# Patient Record
Sex: Female | Born: 1940 | Race: White | Hispanic: No | Smoking: Never smoker
Health system: Southern US, Community
[De-identification: ages and names within clinical notes are randomized; demographics above are authoritative.]

## PROBLEM LIST (undated history)

## (undated) DIAGNOSIS — C189 Malignant neoplasm of colon, unspecified: Secondary | ICD-10-CM

## (undated) HISTORY — DX: Malignant neoplasm of colon, unspecified: C18.9

---

## 1992-12-12 HISTORY — PX: ABDOMINAL HYSTERECTOMY: SHX81

## 1996-12-12 HISTORY — PX: OTHER SURGICAL HISTORY: SHX169

## 1998-12-12 DIAGNOSIS — C189 Malignant neoplasm of colon, unspecified: Secondary | ICD-10-CM

## 1998-12-12 HISTORY — PX: COLON SURGERY: SHX602

## 1998-12-12 HISTORY — DX: Malignant neoplasm of colon, unspecified: C18.9

## 2005-08-11 ENCOUNTER — Ambulatory Visit: Payer: Self-pay | Admitting: Internal Medicine

## 2005-08-17 ENCOUNTER — Ambulatory Visit: Payer: Self-pay | Admitting: Oncology

## 2005-09-11 ENCOUNTER — Ambulatory Visit: Payer: Self-pay | Admitting: Oncology

## 2006-01-16 ENCOUNTER — Emergency Department: Payer: Self-pay | Admitting: Emergency Medicine

## 2006-01-16 ENCOUNTER — Other Ambulatory Visit: Payer: Self-pay

## 2006-02-27 ENCOUNTER — Ambulatory Visit: Payer: Self-pay | Admitting: Psychiatry

## 2006-03-15 ENCOUNTER — Ambulatory Visit: Payer: Self-pay | Admitting: Oncology

## 2006-04-11 ENCOUNTER — Ambulatory Visit: Payer: Self-pay | Admitting: Oncology

## 2006-09-27 ENCOUNTER — Ambulatory Visit: Payer: Self-pay | Admitting: Oncology

## 2006-10-03 ENCOUNTER — Ambulatory Visit: Payer: Self-pay | Admitting: Internal Medicine

## 2006-10-12 ENCOUNTER — Ambulatory Visit: Payer: Self-pay | Admitting: Oncology

## 2006-11-30 ENCOUNTER — Ambulatory Visit: Payer: Self-pay | Admitting: Oncology

## 2006-12-12 ENCOUNTER — Ambulatory Visit: Payer: Self-pay | Admitting: Oncology

## 2007-01-31 ENCOUNTER — Ambulatory Visit: Payer: Self-pay | Admitting: Oncology

## 2007-02-10 ENCOUNTER — Ambulatory Visit: Payer: Self-pay | Admitting: Internal Medicine

## 2007-02-16 ENCOUNTER — Ambulatory Visit: Payer: Self-pay | Admitting: Internal Medicine

## 2007-03-13 ENCOUNTER — Ambulatory Visit: Payer: Self-pay | Admitting: Internal Medicine

## 2007-06-12 ENCOUNTER — Ambulatory Visit: Payer: Self-pay | Admitting: Oncology

## 2007-07-13 ENCOUNTER — Ambulatory Visit: Payer: Self-pay | Admitting: Oncology

## 2007-08-13 ENCOUNTER — Ambulatory Visit: Payer: Self-pay | Admitting: Oncology

## 2007-08-23 ENCOUNTER — Ambulatory Visit: Payer: Self-pay | Admitting: Oncology

## 2007-09-12 ENCOUNTER — Ambulatory Visit: Payer: Self-pay | Admitting: Oncology

## 2008-01-04 ENCOUNTER — Other Ambulatory Visit: Payer: Self-pay

## 2008-01-04 ENCOUNTER — Observation Stay: Payer: Self-pay | Admitting: Internal Medicine

## 2008-02-10 ENCOUNTER — Ambulatory Visit: Payer: Self-pay | Admitting: Oncology

## 2008-02-28 ENCOUNTER — Ambulatory Visit: Payer: Self-pay | Admitting: Gastroenterology

## 2008-03-13 ENCOUNTER — Ambulatory Visit: Payer: Self-pay | Admitting: Unknown Physician Specialty

## 2008-08-19 ENCOUNTER — Ambulatory Visit: Payer: Self-pay | Admitting: Internal Medicine

## 2008-12-12 ENCOUNTER — Ambulatory Visit: Payer: Self-pay | Admitting: Oncology

## 2008-12-18 ENCOUNTER — Ambulatory Visit: Payer: Self-pay | Admitting: Oncology

## 2009-01-12 ENCOUNTER — Ambulatory Visit: Payer: Self-pay | Admitting: Oncology

## 2010-10-14 ENCOUNTER — Ambulatory Visit: Payer: Self-pay | Admitting: Gastroenterology

## 2010-10-19 ENCOUNTER — Ambulatory Visit: Payer: Self-pay | Admitting: Gastroenterology

## 2010-11-02 ENCOUNTER — Ambulatory Visit: Payer: Self-pay | Admitting: Gastroenterology

## 2011-02-23 ENCOUNTER — Ambulatory Visit: Payer: Self-pay | Admitting: Family Medicine

## 2011-06-08 ENCOUNTER — Ambulatory Visit: Payer: Self-pay | Admitting: Internal Medicine

## 2011-10-12 ENCOUNTER — Ambulatory Visit: Payer: Self-pay | Admitting: Urology

## 2011-12-13 HISTORY — PX: COLONOSCOPY: SHX174

## 2012-05-11 ENCOUNTER — Ambulatory Visit: Payer: Self-pay | Admitting: Internal Medicine

## 2012-09-07 ENCOUNTER — Ambulatory Visit: Payer: Self-pay | Admitting: Internal Medicine

## 2013-06-05 ENCOUNTER — Ambulatory Visit: Payer: Self-pay | Admitting: Internal Medicine

## 2013-06-10 ENCOUNTER — Other Ambulatory Visit: Payer: Self-pay

## 2013-06-10 ENCOUNTER — Ambulatory Visit (INDEPENDENT_AMBULATORY_CARE_PROVIDER_SITE_OTHER): Payer: Medicare Other | Admitting: General Surgery

## 2013-06-10 ENCOUNTER — Encounter: Payer: Self-pay | Admitting: General Surgery

## 2013-06-10 VITALS — BP 140/70 | HR 78 | Resp 16 | Ht 65.0 in | Wt 125.0 lb

## 2013-06-10 DIAGNOSIS — N63 Unspecified lump in unspecified breast: Secondary | ICD-10-CM

## 2013-06-10 NOTE — Progress Notes (Signed)
Patient ID: Wanda [name], female   DOB: [birth year], 72 y.o.   MRN: [id number]  Chief Complaint  Patient presents with  . Other    mammogram    HPI Wanda [name] is a 72 y.o. female who presents for a breast evaluation. The most recent mammogram was done on 06/05/13 as well as a right breast ultrasound with a birad category 5.  Patient does not perform regular self breast checks and does not get regular mammograms done. No family history of breast cancer. No prior problems with her breasts. She states she noticed her right nipple was indented on 06/04/13. She also states she is having some right breast pain that has been ongoing for approximately 1 month.   HPI  Past Medical History  Diagnosis Date  . Colon cancer 2000    Past Surgical History  Procedure Laterality Date  . Marchetta  1998  . Abdominal hysterectomy  1994  . Colon surgery  2000    History reviewed. No pertinent family history.  Social History History  Substance Use Topics  . Smoking status: Never Smoker   . Smokeless tobacco: Never Used  . Alcohol Use: No    Allergies  Allergen Reactions  . Iodine Shortness Of Breath  . Eggs Or Egg-Derived Products Diarrhea  . Ivp Dye (Iodinated Diagnostic Agents)   . Lactose Intolerance (Gi) Diarrhea  . Penicillins   . Shellfish Allergy   . Ciprofloxacin Rash    Current Outpatient Prescriptions  Medication Sig Dispense Refill  . alprazolam (XANAX) 2 MG tablet Take 2 mg by mouth at bedtime as needed for sleep.      . Biotin 1000 MCG tablet Take 1,000 mcg by mouth 3 (three) times daily.      Marland Kitchen FLUoxetine (PROZAC) 20 MG capsule Take 20 mg by mouth daily.      . Lecithin 400 MG CAPS Take 1 capsule by mouth daily.      Marland Kitchen OVER THE COUNTER MEDICATION Take 140 mg by mouth daily. Fura mag      . pantoprazole (PROTONIX) 40 MG tablet Take 40 mg by mouth daily.      . Potassium 99 MG TABS Take 1 tablet by mouth daily.       No current facility-administered medications for  this visit.    Review of Systems Review of Systems  Constitutional: Negative.   Respiratory: Negative.   Cardiovascular: Negative.     Blood pressure 140/70, pulse 78, resp. rate 16, height 5\' 5"  (1.651 m), weight 125 lb (56.7 kg).  Physical Exam Physical Exam  Constitutional: She appears well-developed and well-nourished.  Neck: Trachea normal. No mass and no thyromegaly present.  Cardiovascular: Normal rate, regular rhythm and normal heart sounds.   No murmur heard. Pulmonary/Chest: Effort normal and breath sounds normal. Right breast exhibits mass and tenderness. Right breast exhibits no inverted nipple, no nipple discharge and no skin change. Left breast exhibits no inverted nipple, no mass, no nipple discharge, no skin change and no tenderness.  2 cm mass of the right edge of areola 5 o'clock position. Small axillary node.   Abdominal: Soft. Normal appearance and bowel sounds are normal. There is no tenderness. No hernia.  Lymphadenopathy:    She has no cervical adenopathy.    She has axillary adenopathy.  Neurological: She is alert.  Skin: Skin is warm and dry.    Data Reviewed Bilateral mammograms dated 06/05/2013 and ultrasound the same date were reviewed. A macro lobulated mass is noted in  the right breast in the retroareolar area. Ultrasound confirmed a heterogeneous mass measuring up to 1.7 cm with internal Doppler flow. BI-RAD-5.  Ultrasound examination of the breast confirmed an irregular mass measuring up to 1.86 cm at the 4 clock position of the breast 2 cm from the nipple. Ultrasound examination of the axilla and the area of palpable thickening shows a 0.69 x 1.0 cm nodule consistent with a lymph node. Increased vascular flow in the hilum is appreciated.  The patient was amenable to core biopsy. The procedure was reviewed and she was prepped with alcohol. 10 cc of 0.5% solitaire with 0.25% Marcaine with 1 200,000 units of epinephrine was utilized well tolerated.  A  14-gauge Bard Tru-Cut needle was used and multiple core samples obtained through the mass. A postbiopsy clip was not utilized. Scant bleeding was noted. The skin defect was closed with benzoin Steri-Strips followed by Telfa and Tegaderm dressing. Post procedure instructions were provided to the patient   Assessment    Right breast cancer.     Plan    The patient will be contacted when her biopsy results are available.        Earline Mayotte 06/11/2013, 4:32 PM

## 2013-06-10 NOTE — Patient Instructions (Signed)

## 2013-06-11 ENCOUNTER — Telehealth: Payer: Self-pay | Admitting: General Surgery

## 2013-06-11 ENCOUNTER — Encounter: Payer: Self-pay | Admitting: General Surgery

## 2013-06-11 DIAGNOSIS — C50919 Malignant neoplasm of unspecified site of unspecified female breast: Secondary | ICD-10-CM | POA: Insufficient documentation

## 2013-06-11 DIAGNOSIS — C50911 Malignant neoplasm of unspecified site of right female breast: Secondary | ICD-10-CM

## 2013-06-11 NOTE — Telephone Encounter (Signed)
The patient was notified that the right breast biopsy completed yesterday did confirm the clinical suspicion of invasive mammary cancer. Arrangements are in place for an office visit to discuss options for management on 06/13/2013 at 3 PM the

## 2013-06-12 ENCOUNTER — Other Ambulatory Visit: Payer: Self-pay | Admitting: *Deleted

## 2013-06-12 DIAGNOSIS — C50919 Malignant neoplasm of unspecified site of unspecified female breast: Secondary | ICD-10-CM

## 2013-06-12 NOTE — Progress Notes (Signed)
The patient will need a CEA and CA 27-29 as well as a PA and lateral chest x-ray. She is presently scheduled to come to the office on Thursday, July 3 at 3 PM. These could be completed before or after that visit. Thank you  Patient will be sent to the lab and to have x-ray completed tomorrow, 7-02-09-13.

## 2013-06-13 ENCOUNTER — Ambulatory Visit (INDEPENDENT_AMBULATORY_CARE_PROVIDER_SITE_OTHER): Payer: Medicare Other | Admitting: General Surgery

## 2013-06-13 ENCOUNTER — Encounter: Payer: Self-pay | Admitting: General Surgery

## 2013-06-13 ENCOUNTER — Ambulatory Visit: Payer: Self-pay

## 2013-06-13 VITALS — BP 140/78 | HR 68 | Resp 12 | Ht 65.0 in | Wt 125.0 lb

## 2013-06-13 DIAGNOSIS — C50919 Malignant neoplasm of unspecified site of unspecified female breast: Secondary | ICD-10-CM

## 2013-06-13 NOTE — Patient Instructions (Signed)
Patient to have labs and a chest x-ray. 

## 2013-06-13 NOTE — Progress Notes (Addendum)
Patient ID: Wanda [name], female   DOB: 02-Jul-1941, 72 y.o.   MRN: [id number]  Chief Complaint  Patient presents with  . Other    HPI Wanda [name] is a 72 y.o. female.  The patient presents today to review results of her recently completed right breast biopsy. She is accompanied by her son, Onalee Hua. She had been notified of the results by phone.   HPI  Past Medical History  Diagnosis Date  . Colon cancer 2000    Past Surgical History  Procedure Laterality Date  . Marchetta  1998  . Abdominal hysterectomy  1994  . Colon surgery  2000    No family history on file.  Social History History  Substance Use Topics  . Smoking status: Never Smoker   . Smokeless tobacco: Never Used  . Alcohol Use: No    Allergies  Allergen Reactions  . Iodine Shortness Of Breath  . Eggs Or Egg-Derived Products Diarrhea  . Ivp Dye (Iodinated Diagnostic Agents)   . Lactose Intolerance (Gi) Diarrhea  . Penicillins   . Shellfish Allergy   . Ciprofloxacin Rash    Current Outpatient Prescriptions  Medication Sig Dispense Refill  . alprazolam (XANAX) 2 MG tablet Take 2 mg by mouth at bedtime as needed for sleep.      . Biotin 1000 MCG tablet Take 1,000 mcg by mouth 3 (three) times daily.      Marland Kitchen FLUoxetine (PROZAC) 20 MG capsule Take 20 mg by mouth daily.      . Lecithin 400 MG CAPS Take 1 capsule by mouth daily.      Marland Kitchen OVER THE COUNTER MEDICATION Take 140 mg by mouth daily. Fura mag      . pantoprazole (PROTONIX) 40 MG tablet Take 40 mg by mouth daily.      . Potassium 99 MG TABS Take 1 tablet by mouth daily.       No current facility-administered medications for this visit.    Review of Systems Review of Systems  Blood pressure 140/78, pulse 68, resp. rate 12, height 5\' 5"  (1.651 m), weight 125 lb (56.7 kg).  Physical Exam Physical Exam Examination of the biopsy site showed slight skin irritation from traction by the Tegaderm dressing. This was removed. Mild bruising at the 9:00  location of the areola. No evidence of infection.  Data Reviewed Pathology shows evidence of an invasive mammary carcinoma, histologic grade 3. Ultrasound suggests a minimum diameter of 1.8 cm. A small axillary node is identified which while normal in size is somewhat firm with a slightly irregular architecture. Clinically stage I.  Comprehensive metabolic panel dated 05/23/2013 was notable for blood sugar 102, normal electrolytes and renal function, normal liver function studies, moderate elevation of the serum cholesterol.  Assessment    Stage I right breast cancer.    Plan    The central location of the tumor would make breast conservation very difficult. The patient is interested in breast reconstruction, preferably immediately after mastectomy. Considering her modest body habitus to nonsmoking status, she will likely be an excellent candidate for many reconstructive options.  She is amenable to meet with the plastic surgeon for his advice. The plan will be scheduled late in the day to facilitate her son, Onalee Hua, being able to attend.  After evaluation by plastic surgery we will look for a date for mastectomy with sentinel node/possible axillary dissection with immediate reconstruction.       Earline Mayotte 06/13/2013, 4:26 PM

## 2013-06-14 LAB — CBC WITH DIFFERENTIAL/PLATELET
Basos: 1 % (ref 0–3)
Eos: 2 % (ref 0–5)
Eosinophils Absolute: 0.1 10*3/uL (ref 0.0–0.4)
Immature Grans (Abs): 0 10*3/uL (ref 0.0–0.1)
Lymphs: 29 % (ref 14–46)
MCH: 31.2 pg (ref 26.6–33.0)
Monocytes: 10 % (ref 4–12)
Neutrophils Relative %: 58 % (ref 40–74)
RBC: 4.46 x10E6/uL (ref 3.77–5.28)
WBC: 5.4 10*3/uL (ref 3.4–10.8)

## 2013-06-14 LAB — CEA: CEA: 1.8 ng/mL (ref 0.0–4.7)

## 2013-06-19 ENCOUNTER — Ambulatory Visit: Payer: Medicare Other

## 2013-06-20 ENCOUNTER — Encounter: Payer: Self-pay | Admitting: General Surgery

## 2013-06-21 LAB — PATHOLOGY

## 2013-06-24 ENCOUNTER — Other Ambulatory Visit: Payer: Self-pay | Admitting: *Deleted

## 2013-06-24 DIAGNOSIS — C50919 Malignant neoplasm of unspecified site of unspecified female breast: Secondary | ICD-10-CM

## 2013-06-24 NOTE — Progress Notes (Signed)
Patient to have labs drawn again at [facility] since [employer] missed drawing a CA 27-29 at the time of other labs that were drawn on 06-13-13. Bonita Quin has left a message for patient to call the office.   Lab slip has already been taken to [place] lab.

## 2013-06-26 LAB — CANCER ANTIGEN 27.29: CA 27.29: 17.3 U/mL (ref 0.0–38.6)

## 2013-07-01 ENCOUNTER — Telehealth: Payer: Self-pay | Admitting: *Deleted

## 2013-07-01 NOTE — Telephone Encounter (Signed)
Patient called in and spoke with Shawna Orleans wanting her lab results. She was very anxious since [employer] had missed drawing her CA 27-29 on 06-13-13. Dr. Evette Cristal reviewed labs from 06-25-13 and I told patient they looked okay. I also told her we were looking at completing her surgery on 07-18-13 with Dr. Meriam Sprague. Patient would also like a call back directly from you to discuss her ER/PR results. I told her we would be in touch with her with the details on her surgery once I heard back from you.

## 2013-07-02 ENCOUNTER — Telehealth: Payer: Self-pay | Admitting: General Surgery

## 2013-07-02 NOTE — Telephone Encounter (Signed)
Message left that CA 27-29 results reviewed.  ER/PR +. Plans for preop visit prior to planned mastectomy/ reconstruction August 7 w/ Dr. Meriam Sprague.

## 2013-07-02 NOTE — Telephone Encounter (Signed)
Message left on phone. Review in detail at preop appt.

## 2013-07-09 ENCOUNTER — Other Ambulatory Visit: Payer: Self-pay | Admitting: General Surgery

## 2013-07-09 DIAGNOSIS — C50919 Malignant neoplasm of unspecified site of unspecified female breast: Secondary | ICD-10-CM

## 2013-07-10 ENCOUNTER — Encounter: Payer: Self-pay | Admitting: General Surgery

## 2013-07-10 ENCOUNTER — Ambulatory Visit (INDEPENDENT_AMBULATORY_CARE_PROVIDER_SITE_OTHER): Payer: Medicare Other | Admitting: General Surgery

## 2013-07-10 ENCOUNTER — Ambulatory Visit: Payer: Self-pay | Admitting: General Surgery

## 2013-07-10 VITALS — BP 118/70 | HR 72 | Resp 12 | Ht 66.0 in | Wt 134.0 lb

## 2013-07-10 DIAGNOSIS — Z01810 Encounter for preprocedural cardiovascular examination: Secondary | ICD-10-CM

## 2013-07-10 DIAGNOSIS — C50919 Malignant neoplasm of unspecified site of unspecified female breast: Secondary | ICD-10-CM

## 2013-07-10 NOTE — Progress Notes (Signed)
Patient ID: Wanda [name], female   DOB: 07/04/1941, 73 y.o.   MRN: [id number]  Chief Complaint  Patient presents with  . Pre-op Exam    right mastectomy     HPI Wanda [name] is a 72 y.o. female here today for her pre op right breast mastectomy . Patient has been evaluated by plastic surgery.   HPI  Past Medical History  Diagnosis Date  . Colon cancer 2000    Past Surgical History  Procedure Laterality Date  . Wanda [name]  1998  . Abdominal hysterectomy  1994  . Colon surgery  2000  . [place]  2013    [place]    No family history on file.  Social History History  Substance Use Topics  . Smoking status: Never Smoker   . Smokeless tobacco: Never Used  . Alcohol Use: No    Allergies  Allergen Reactions  . Iodine Shortness Of Breath  . Eggs Or Egg-Derived Products Diarrhea  . Ivp Dye (Iodinated Diagnostic Agents)   . Lactose Intolerance (Gi) Diarrhea  . Penicillins   . Shellfish Allergy   . Ciprofloxacin Rash    Current Outpatient Prescriptions  Medication Sig Dispense Refill  . alprazolam (XANAX) 2 MG tablet Take 2 mg by mouth at bedtime as needed for sleep.      . Biotin 1000 MCG tablet Take 1,000 mcg by mouth 3 (three) times daily.      Marland Kitchen FLUoxetine (PROZAC) 20 MG capsule Take 20 mg by mouth daily.      . Lecithin 400 MG CAPS Take 1 capsule by mouth daily.      Marland Kitchen OVER THE COUNTER MEDICATION Take 140 mg by mouth daily. Fura mag      . pantoprazole (PROTONIX) 40 MG tablet Take 40 mg by mouth daily.      . Potassium 99 MG TABS Take 1 tablet by mouth daily.       No current facility-administered medications for this visit.    Review of Systems Review of Systems  Constitutional: Negative.   Respiratory: Negative.   Cardiovascular: Negative.     Blood pressure 118/70, pulse 72, resp. rate 12, height 5\' 6"  (1.676 m), weight 134 lb (60.782 kg).  Physical Exam Physical Exam  Constitutional: She is oriented to person, place, and time. She appears  well-developed and well-nourished.  Cardiovascular: Normal rate, regular rhythm and normal heart sounds.   Pulmonary/Chest: Breath sounds normal.  Right breast 2 cm mass uncharged  Neurological: She is alert and oriented to person, place, and time.  Skin: Skin is warm and dry.    Data Reviewed Office notes from Caryl Asp, M.D.    Assessment    Right breast cancer, desire for immediate reconstruction.     Plan    Procedure reviewed. Possibility of delayed reconstruction discussed. Sacrifice of nipple will be necessary secondary to proximity of tumor to nipple.        Earline Mayotte 07/10/2013, 11:58 AM

## 2013-07-11 ENCOUNTER — Encounter: Payer: Self-pay | Admitting: General Surgery

## 2013-07-18 ENCOUNTER — Ambulatory Visit: Payer: Self-pay | Admitting: General Surgery

## 2013-07-18 DIAGNOSIS — C50919 Malignant neoplasm of unspecified site of unspecified female breast: Secondary | ICD-10-CM

## 2013-07-18 HISTORY — PX: BREAST SURGERY: SHX581

## 2013-07-18 LAB — URINALYSIS, COMPLETE
Ph: 8 (ref 4.5–8.0)
RBC,UR: <1 /HPF (ref 0–5)
Squamous Epithelial: 1

## 2013-07-19 ENCOUNTER — Encounter: Payer: Self-pay | Admitting: General Surgery

## 2013-07-23 ENCOUNTER — Telehealth: Payer: Self-pay | Admitting: General Surgery

## 2013-07-23 ENCOUNTER — Encounter: Payer: Self-pay | Admitting: General Surgery

## 2013-07-23 LAB — HER-2 / NEU, FISH
Avg Num Her-2 signals/nucleus:: 6.6
HER-2/CEP17 Ratio: 1.83

## 2013-07-23 LAB — ER/PR,IMMUNOHISTOCHEM,PARAFFIN
Estrogen Receptor IHC: 90 %
Progesterone Recp IP: 15 %

## 2013-07-23 NOTE — Telephone Encounter (Signed)
Patient notified that only one lymph node of 10 was positive for malignancy. She reports that she is doing well. F/U tomorrow w/ Dr. Meriam Sprague from Plastic Surgery.

## 2013-07-30 ENCOUNTER — Encounter: Payer: Self-pay | Admitting: General Surgery

## 2013-07-30 ENCOUNTER — Ambulatory Visit (INDEPENDENT_AMBULATORY_CARE_PROVIDER_SITE_OTHER): Payer: Medicare Other | Admitting: General Surgery

## 2013-07-30 VITALS — BP 120/70 | HR 80 | Resp 14 | Ht 65.0 in | Wt 125.0 lb

## 2013-07-30 DIAGNOSIS — C50911 Malignant neoplasm of unspecified site of right female breast: Secondary | ICD-10-CM

## 2013-07-30 DIAGNOSIS — C50919 Malignant neoplasm of unspecified site of unspecified female breast: Secondary | ICD-10-CM

## 2013-07-30 NOTE — Progress Notes (Signed)
Patient ID: Wanda [name], female   DOB: 01-26-1941, 72 y.o.   MRN: [id number]  Chief Complaint  Patient presents with  . Routine Post Op    HPI Wanda [name] is a 72 y.o. female.  Patient here today for postop visit right mastectomy SN biopsy axillary dissection.  States she is doing well, using pain medication occasionally. Drain output has been averaging 45 ml a day (forgot drain sheet). She is experiencing moderate discomfort, especially with abduction of the right arm  HPI  Past Medical History  Diagnosis Date  . Colon cancer 2000    [place]    Past Surgical History  Procedure Laterality Date  . Marchetta  1998  . Abdominal hysterectomy  1994  . Colon surgery  2000  . [place]oscopy  2013    [place].  Marland Kitchen Breast surgery Right 07-18-13    mastectomy SN biopsy axillary dissection    No family history on file.  Social History History  Substance Use Topics  . Smoking status: Never Smoker   . Smokeless tobacco: Never Used  . Alcohol Use: No    Allergies  Allergen Reactions  . Iodine Shortness Of Breath  . Adhesive [Tape] Other (See Comments)    blisters  . Eggs Or Egg-Derived Products Diarrhea  . Ivp Dye [Iodinated Diagnostic Agents]   . Lactose Intolerance (Gi) Diarrhea  . Penicillins   . Shellfish Allergy   . Ciprofloxacin Rash    Current Outpatient Prescriptions  Medication Sig Dispense Refill  . HYDROcodone-acetaminophen (NORCO/VICODIN) 5-325 MG per tablet Take 1 tablet by mouth every 6 (six) hours as needed for pain.      Marland Kitchen alprazolam (XANAX) 2 MG tablet Take 2 mg by mouth at bedtime as needed for sleep.      Marland Kitchen FLUoxetine (PROZAC) 20 MG capsule Take 20 mg by mouth daily.      . pantoprazole (PROTONIX) 40 MG tablet Take 40 mg by mouth daily.       No current facility-administered medications for this visit.    Review of Systems Review of Systems  Constitutional: Negative.   Respiratory: Negative.   Cardiovascular: Negative.     Blood pressure 120/70,  pulse 80, resp. rate 14, height 5\' 5"  (1.651 m), weight 125 lb (56.7 kg).  Physical Exam Physical Exam  Constitutional: She is oriented to person, place, and time. She appears well-developed and well-nourished.  Musculoskeletal:  120 degree abduction  Neurological: She is alert and oriented to person, place, and time.  Skin: Skin is warm and dry.  Right breast incision healing well and drain in place. (The nipple was sacrificed due to with close proximity to the tumor).  Data Reviewed Pathology showed a 2.5 cm tumor with one positive sentinel node (14 mm), 9 additional nodes negative. ER 90%, PR 15%, HER-2/neu overexpressing.  Assessment    Doing well status post right skin sparing mastectomy with immediate reconstruction. Stage II.    Plan    The patient is a candidate for adjuvant chemotherapy. She had previously been treated by Dr. Doylene Canning and desired to follow up with him in this regard.    This patient will return to the office next week for follow up.  Patient has been scheduled for an appointment with Dr. Doylene Canning for 08-13-13 at 11 am. She is aware of date, time, and instructions.  Earline Mayotte 07/31/2013, 5:54 PM

## 2013-07-30 NOTE — Patient Instructions (Addendum)
Continue drain record sheet Call for any new questions or concerns.  Patient has been scheduled for an appointment with Dr. Doylene Canning for 08-13-13 at 11 am. She is aware of date, time, and instructions.

## 2013-07-31 ENCOUNTER — Encounter: Payer: Self-pay | Admitting: General Surgery

## 2013-08-07 ENCOUNTER — Encounter: Payer: Self-pay | Admitting: General Surgery

## 2013-08-07 ENCOUNTER — Ambulatory Visit (INDEPENDENT_AMBULATORY_CARE_PROVIDER_SITE_OTHER): Payer: Medicare Other | Admitting: General Surgery

## 2013-08-07 VITALS — BP 124/72 | HR 70 | Resp 12 | Ht 65.0 in

## 2013-08-07 DIAGNOSIS — C50919 Malignant neoplasm of unspecified site of unspecified female breast: Secondary | ICD-10-CM

## 2013-08-07 DIAGNOSIS — C50911 Malignant neoplasm of unspecified site of right female breast: Secondary | ICD-10-CM

## 2013-08-07 NOTE — Patient Instructions (Addendum)
Patient to follow up in one month

## 2013-08-07 NOTE — Progress Notes (Signed)
Patient ID: Wanda [name], female   DOB: Jan 16, 1941, 72 y.o.   MRN: [id number]  Chief Complaint  Patient presents with  . Routine Post Op    right mastectomy    HPI Wanda [name] is a 72 y.o. female here today for postop visit right mastectomy SN biopsy followed by axillary dissection completed on 07/18/13. She reports she is doing well. The patient describes significant pain after the first fill of her implant which has now resolved.  HPI  Past Medical History  Diagnosis Date  . Colon cancer 2000    [place]    Past Surgical History  Procedure Laterality Date  . Marchetta  1998  . Abdominal hysterectomy  1994  . Colon surgery  2000  . [place]  2013    [place].  Marland Kitchen Breast surgery Right 07-18-13    mastectomy SN biopsy axillary dissection    No family history on file.  Social History History  Substance Use Topics  . Smoking status: Never Smoker   . Smokeless tobacco: Never Used  . Alcohol Use: No    Allergies  Allergen Reactions  . Iodine Shortness Of Breath  . Adhesive [Tape] Other (See Comments)    blisters  . Eggs Or Egg-Derived Products Diarrhea  . Ivp Dye [Iodinated Diagnostic Agents]   . Lactose Intolerance (Gi) Diarrhea  . Penicillins   . Shellfish Allergy   . Ciprofloxacin Rash    Current Outpatient Prescriptions  Medication Sig Dispense Refill  . alprazolam (XANAX) 2 MG tablet Take 2 mg by mouth at bedtime as needed for sleep.      Marland Kitchen FLUoxetine (PROZAC) 20 MG capsule Take 20 mg by mouth daily.      Marland Kitchen HYDROcodone-acetaminophen (NORCO/VICODIN) 5-325 MG per tablet Take 1 tablet by mouth every 6 (six) hours as needed for pain.      . pantoprazole (PROTONIX) 40 MG tablet Take 40 mg by mouth daily.       No current facility-administered medications for this visit.    Review of Systems Review of Systems  Constitutional: Negative.   Respiratory: Negative.   Cardiovascular: Negative.     Blood pressure 124/72, pulse 70, resp. rate 12, height 5\' 5"   (1.651 m).  Physical Exam Physical Exam  Constitutional: She is oriented to person, place, and time. She appears well-developed and well-nourished.  Cardiovascular: Normal rate, regular rhythm and normal heart sounds.   Pulmonary/Chest: Breath sounds normal.  Lymphadenopathy:    She has no cervical adenopathy.  Neurological: She is alert and oriented to person, place, and time.  Skin: Skin is warm and dry.  Neck shoulder range of motion is very good. She may lack about 15-20 of full abduction.  Data Reviewed Previously reviewed.  Assessment    T2, N1 carcinoma right breast status post skin sparing/nipple sparing mastectomy and areolar reconstruction.     Plan    Arrangements are in place for medical oncology evaluation on August 13, 2013.  Port placement and indication was reviewed with the patient today. She  may be a candidate for adjuvant treatment based on her nodal disease.    [facility] may be calling to arrange for port placement, if they do this can be arranged. There will be no need for a pre-op visit and patient will qualify for a phone interview.  Earline Mayotte 08/08/2013, 7:17 PM

## 2013-08-08 ENCOUNTER — Encounter: Payer: Self-pay | Admitting: General Surgery

## 2013-08-13 ENCOUNTER — Telehealth: Payer: Self-pay | Admitting: *Deleted

## 2013-08-13 ENCOUNTER — Ambulatory Visit: Payer: Self-pay | Admitting: Oncology

## 2013-08-13 ENCOUNTER — Other Ambulatory Visit: Payer: Self-pay | Admitting: General Surgery

## 2013-08-13 DIAGNOSIS — C50911 Malignant neoplasm of unspecified site of right female breast: Secondary | ICD-10-CM

## 2013-08-13 NOTE — Telephone Encounter (Signed)
Message for patient to call the office.   We have her scheduled for port placement at [facility] for 08-22-13. Christina at the [facility] is aware of date.

## 2013-08-13 NOTE — Telephone Encounter (Signed)
Patient's port placement has been scheduled for 08-22-13 at [facility]. This patient is aware of date and instructions.  Please do orders. Thanks.

## 2013-08-22 ENCOUNTER — Ambulatory Visit: Payer: Self-pay | Admitting: General Surgery

## 2013-08-22 DIAGNOSIS — C50419 Malignant neoplasm of upper-outer quadrant of unspecified female breast: Secondary | ICD-10-CM

## 2013-08-28 ENCOUNTER — Encounter: Payer: Self-pay | Admitting: General Surgery

## 2013-09-03 ENCOUNTER — Ambulatory Visit: Payer: Self-pay | Admitting: Oncology

## 2013-09-04 ENCOUNTER — Ambulatory Visit: Payer: Medicare Other | Admitting: General Surgery

## 2013-09-05 LAB — CBC CANCER CENTER
Basophil %: 1.2 %
Eosinophil #: 0.2 x10 3/mm (ref 0.0–0.7)
HCT: 40.3 % (ref 35.0–47.0)
HGB: 14.1 g/dL (ref 12.0–16.0)
Lymphocyte #: 0.9 x10 3/mm — ABNORMAL LOW (ref 1.0–3.6)
MCH: 32.1 pg (ref 26.0–34.0)
MCHC: 35.1 g/dL (ref 32.0–36.0)
MCV: 91 fL (ref 80–100)
Monocyte #: 0.5 x10 3/mm (ref 0.2–0.9)
Neutrophil #: 4 x10 3/mm (ref 1.4–6.5)
Platelet: 294 x10 3/mm (ref 150–440)
RBC: 4.41 10*6/uL (ref 3.80–5.20)

## 2013-09-05 LAB — COMPREHENSIVE METABOLIC PANEL
Alkaline Phosphatase: 87 U/L (ref 50–136)
Bilirubin,Total: 0.4 mg/dL (ref 0.2–1.0)
Co2: 29 mmol/L (ref 21–32)
Creatinine: 0.77 mg/dL (ref 0.60–1.30)
EGFR (Non-African Amer.): >60
Osmolality: 269 (ref 275–301)
SGPT (ALT): 20 U/L (ref 12–78)

## 2013-09-10 ENCOUNTER — Ambulatory Visit: Payer: Medicare Other | Admitting: General Surgery

## 2013-09-11 ENCOUNTER — Ambulatory Visit: Payer: Self-pay | Admitting: Oncology

## 2013-09-13 LAB — COMPREHENSIVE METABOLIC PANEL
Albumin: 3.7 g/dL (ref 3.4–5.0)
Alkaline Phosphatase: 87 U/L (ref 50–136)
Anion Gap: 8 (ref 7–16)
Bilirubin,Total: 0.3 mg/dL (ref 0.2–1.0)
Chloride: 97 mmol/L — ABNORMAL LOW (ref 98–107)
Creatinine: 0.75 mg/dL (ref 0.60–1.30)
EGFR (African American): >60
Osmolality: 267 (ref 275–301)
Potassium: 3.7 mmol/L (ref 3.5–5.1)
SGPT (ALT): 59 U/L (ref 12–78)

## 2013-09-13 LAB — CBC CANCER CENTER
Basophil %: 1.4 %
HCT: 38.9 % (ref 35.0–47.0)
Lymphocyte #: 0.5 x10 3/mm — ABNORMAL LOW (ref 1.0–3.6)
MCV: 92 fL (ref 80–100)
Monocyte %: 11.8 %
Neutrophil #: 0.1 x10 3/mm — ABNORMAL LOW (ref 1.4–6.5)
RBC: 4.24 10*6/uL (ref 3.80–5.20)
WBC: 0.8 x10 3/mm — CL (ref 3.6–11.0)

## 2013-09-17 IMAGING — NM NUCLEAR MEDICINE CARDIAC MULTIPLE UPTAKE GATED ACQUISITION SCAN
4 series · 24 of 24 positions shown · non-contrast
Comparison: none

REASON FOR EXAM: high risk chemo
COMMENTS:

[Series 1000: lao 70-gated · 3.30mm/px · 6 of 24 frames shown]
[frame 3/24]
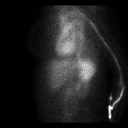
[frame 7/24]
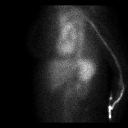
[frame 11/24]
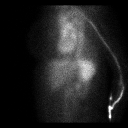
[frame 15/24]
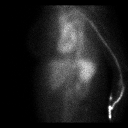
[frame 19/24]
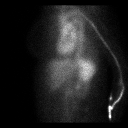
[frame 23/24]
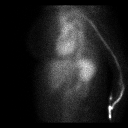

[Series 1000: lao 45-gated (results) · 3.30mm/px · 6 of 24 frames shown]
[frame 3/24]
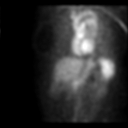
[frame 7/24]
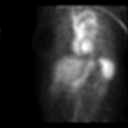
[frame 11/24]
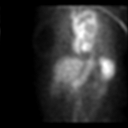
[frame 15/24]
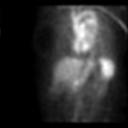
[frame 19/24]
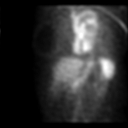
[frame 23/24]
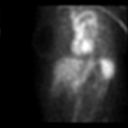

[Series 1000: lao 45-gated · 3.30mm/px · 6 of 24 frames shown]
[frame 3/24]
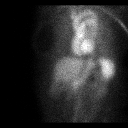
[frame 7/24]
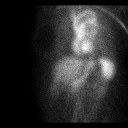
[frame 11/24]
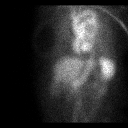
[frame 15/24]
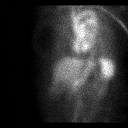
[frame 19/24]
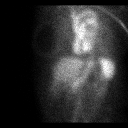
[frame 23/24]
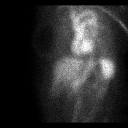

[Series 1000: ant-gated · 3.30mm/px · 6 of 24 frames shown]
[frame 3/24]
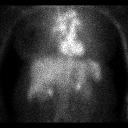
[frame 7/24]
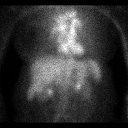
[frame 11/24]
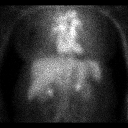
[frame 15/24]
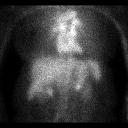
[frame 19/24]
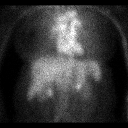
[frame 23/24]
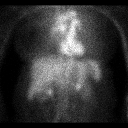

[24 of 24 positions shown; findings below may reference images not displayed]

PROCEDURE:     KNM - KNM REST MUGA SCAN [DATE] OF [DATE]  - [DATE]  [DATE] [DATE] [DATE]

RESULT:

Procedure:  Standard scintigraphic imaging of the mediastinum was obtained
per a resting MUGA protocol.

Radiopharmaceutical:  23.36 mCi of technetium 99m labeled PYP.

Dosage:  3 mm PYP via a left arm injection utilizing a 24 gauge needle.
FINDINGS: The left ventricular ejection fraction is 63.1%.
IMPRESSION: Unremarkable resting MUGA as described above.

## 2013-09-20 LAB — CBC CANCER CENTER
Eosinophil %: 0.1 %
HGB: 12.4 g/dL (ref 12.0–16.0)
Lymphocyte #: 2 x10 3/mm (ref 1.0–3.6)
Lymphocyte %: 10.8 %
MCH: 30.7 pg (ref 26.0–34.0)
Monocyte #: 1.1 x10 3/mm — ABNORMAL HIGH (ref 0.2–0.9)
Monocyte %: 5.7 %
Platelet: 199 x10 3/mm (ref 150–440)
RBC: 4.05 10*6/uL (ref 3.80–5.20)
RDW: 12.5 % (ref 11.5–14.5)

## 2013-09-20 IMAGING — CR DG CHEST 1V PORT
1 series · 1 of 1 positions shown · non-contrast
Comparison: none

REASON FOR EXAM: ERECT, power port placement PACU #7
COMMENTS:

[ap]
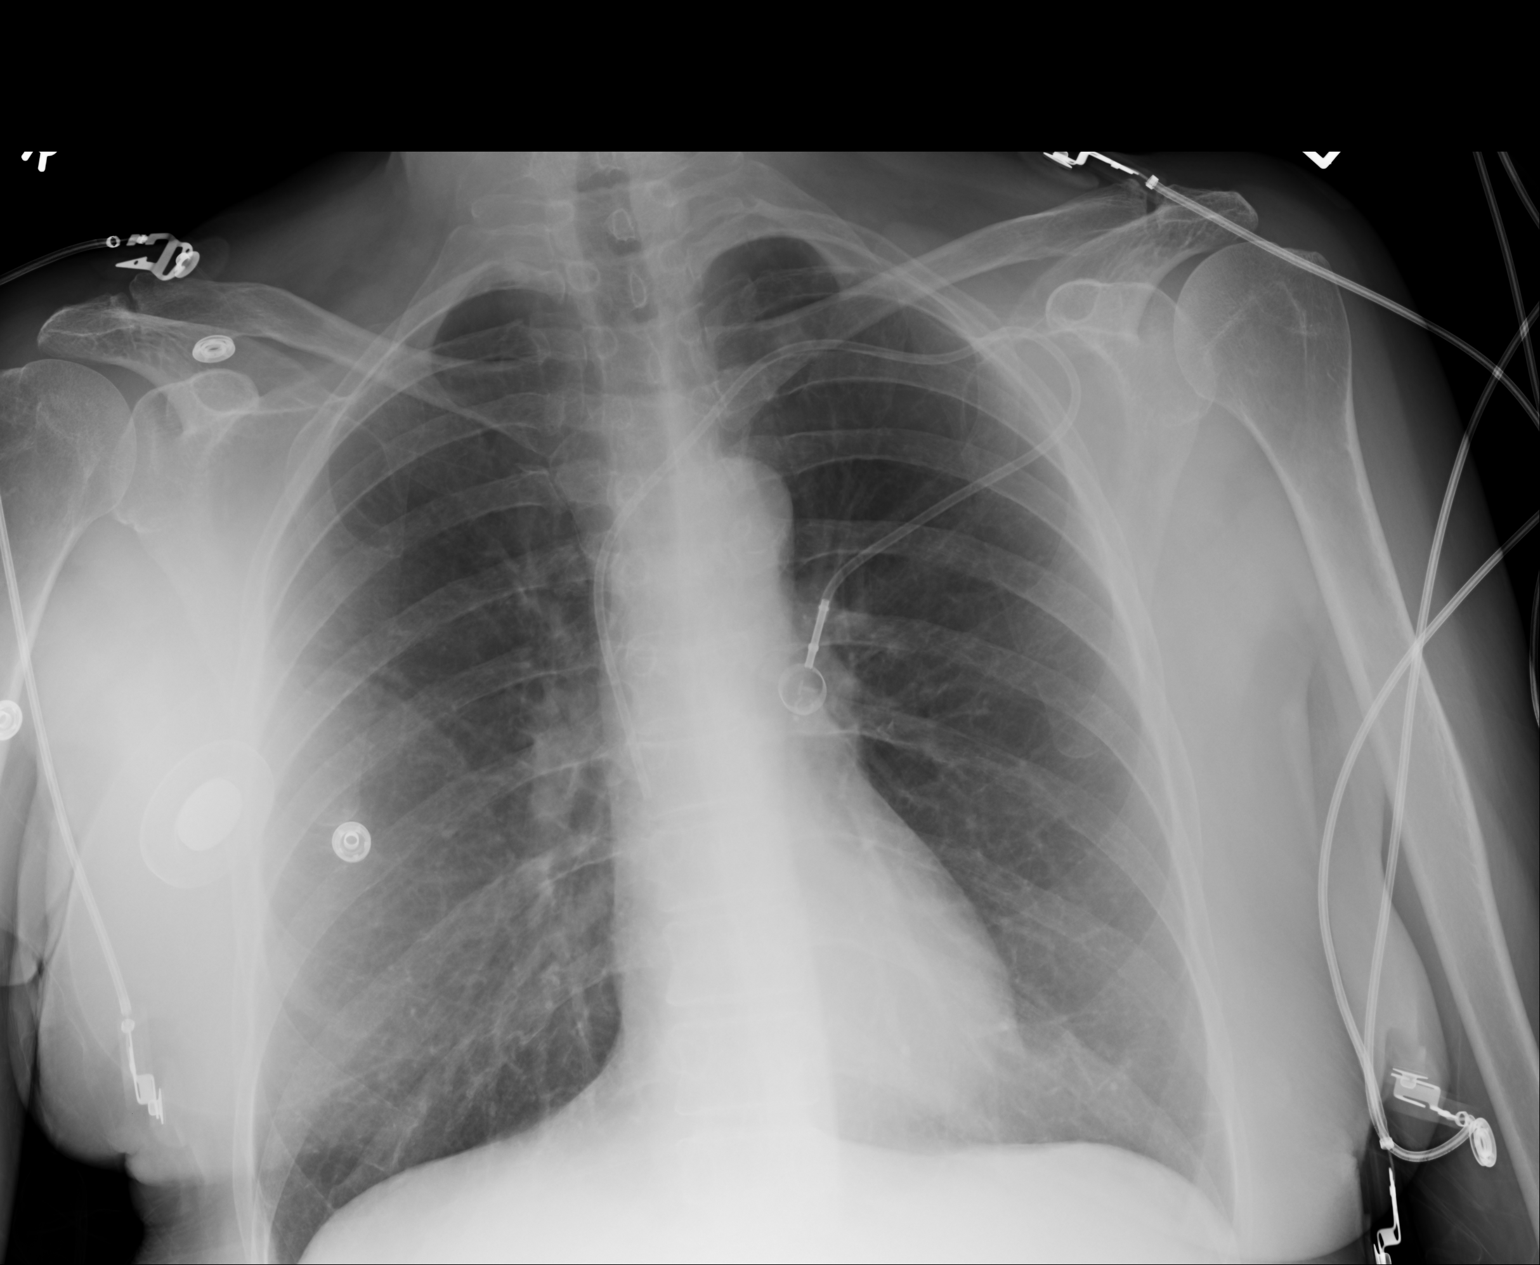

[1 of 1 positions shown; findings below may reference images not displayed]

PROCEDURE:     DXR - DXR PORTABLE CHEST SINGLE VIEW  - August 22, 2013 [DATE]

RESULT:     Comparison is made to the study June 13, 2013.

The lungs are hyperinflated. The cardiac silhouette is normal in size. A
Port-A-Cath appliance is in place. There is no evidence of a post procedure
complication. The tip the catheter lies in the midportion of the SVC. A
breast implant is in place on the right.
IMPRESSION: There is no post procedure complication following placement
of the Port-A-Cath-type appliance.

[REDACTED]

## 2013-09-24 ENCOUNTER — Ambulatory Visit: Payer: Medicare Other | Admitting: General Surgery

## 2013-09-27 LAB — COMPREHENSIVE METABOLIC PANEL
Albumin: 3.5 g/dL (ref 3.4–5.0)
Alkaline Phosphatase: 149 U/L — ABNORMAL HIGH (ref 50–136)
Bilirubin,Total: 0.3 mg/dL (ref 0.2–1.0)
Chloride: 102 mmol/L (ref 98–107)
EGFR (African American): >60
EGFR (Non-African Amer.): >60
Glucose: 89 mg/dL (ref 65–99)
Osmolality: 271 (ref 275–301)
SGPT (ALT): 37 U/L (ref 12–78)

## 2013-09-27 LAB — CBC CANCER CENTER
Basophil #: 0.2 x10 3/mm — ABNORMAL HIGH (ref 0.0–0.1)
Basophil %: 3.2 %
Eosinophil #: 0.1 x10 3/mm (ref 0.0–0.7)
HCT: 35.4 % (ref 35.0–47.0)
Lymphocyte #: 1.2 x10 3/mm (ref 1.0–3.6)
Lymphocyte %: 21.5 %
MCH: 31.4 pg (ref 26.0–34.0)
MCHC: 33.9 g/dL (ref 32.0–36.0)
MCV: 93 fL (ref 80–100)
Monocyte #: 0.5 x10 3/mm (ref 0.2–0.9)
Monocyte %: 9.2 %
Neutrophil #: 3.7 x10 3/mm (ref 1.4–6.5)
Platelet: 368 x10 3/mm (ref 150–440)
RBC: 3.82 10*6/uL (ref 3.80–5.20)
RDW: 13.1 % (ref 11.5–14.5)

## 2013-10-04 LAB — CBC CANCER CENTER
Basophil #: 0 x10 3/mm (ref 0.0–0.1)
Basophil %: 0.8 %
Eosinophil %: 2.6 %
HCT: 30.8 % — ABNORMAL LOW (ref 35.0–47.0)
HGB: 10.4 g/dL — ABNORMAL LOW (ref 12.0–16.0)
MCH: 31.3 pg (ref 26.0–34.0)
Neutrophil %: 57.7 %
Platelet: 161 x10 3/mm (ref 150–440)
RBC: 3.32 10*6/uL — ABNORMAL LOW (ref 3.80–5.20)
RDW: 13.5 % (ref 11.5–14.5)
WBC: 6.3 x10 3/mm (ref 3.6–11.0)

## 2013-10-08 ENCOUNTER — Ambulatory Visit: Payer: Medicare Other | Admitting: General Surgery

## 2013-10-09 LAB — CBC CANCER CENTER
Basophil %: 0.3 %
Eosinophil #: 0 x10 3/mm (ref 0.0–0.7)
Eosinophil %: 0.2 %
HCT: 30.5 % — ABNORMAL LOW (ref 35.0–47.0)
HGB: 10.1 g/dL — ABNORMAL LOW (ref 12.0–16.0)
Lymphocyte #: 1.2 x10 3/mm (ref 1.0–3.6)
Lymphocyte %: 8.6 %
MCHC: 33.3 g/dL (ref 32.0–36.0)
MCV: 93 fL (ref 80–100)
Monocyte #: 1.2 x10 3/mm — ABNORMAL HIGH (ref 0.2–0.9)
Monocyte %: 8.6 %
Neutrophil #: 11.8 x10 3/mm — ABNORMAL HIGH (ref 1.4–6.5)
Platelet: 200 x10 3/mm (ref 150–440)
RBC: 3.28 10*6/uL — ABNORMAL LOW (ref 3.80–5.20)
RDW: 14 % (ref 11.5–14.5)

## 2013-10-09 LAB — COMPREHENSIVE METABOLIC PANEL
Albumin: 3.2 g/dL — ABNORMAL LOW (ref 3.4–5.0)
Alkaline Phosphatase: 196 U/L — ABNORMAL HIGH (ref 50–136)
Anion Gap: 9 (ref 7–16)
BUN: 8 mg/dL (ref 7–18)
Bilirubin,Total: 0.4 mg/dL (ref 0.2–1.0)
Chloride: 98 mmol/L (ref 98–107)
Glucose: 139 mg/dL — ABNORMAL HIGH (ref 65–99)
Potassium: 3.9 mmol/L (ref 3.5–5.1)
SGOT(AST): 20 U/L (ref 15–37)
SGPT (ALT): 25 U/L (ref 12–78)
Sodium: 134 mmol/L — ABNORMAL LOW (ref 136–145)
Total Protein: 6.7 g/dL (ref 6.4–8.2)

## 2013-10-11 ENCOUNTER — Inpatient Hospital Stay: Payer: Self-pay | Admitting: Oncology

## 2013-10-11 DIAGNOSIS — K612 Anorectal abscess: Secondary | ICD-10-CM

## 2013-10-11 LAB — URINALYSIS, COMPLETE
Blood: NEGATIVE
Glucose,UR: NEGATIVE mg/dL (ref 0–75)
Nitrite: NEGATIVE
Protein: NEGATIVE
RBC,UR: 2 /HPF (ref 0–5)
Specific Gravity: 1.02 (ref 1.003–1.030)
Squamous Epithelial: <1

## 2013-10-11 LAB — CBC CANCER CENTER
Eosinophil %: 0.2 %
HCT: 26.6 % — ABNORMAL LOW (ref 35.0–47.0)
Lymphocyte #: 0.7 x10 3/mm — ABNORMAL LOW (ref 1.0–3.6)
MCHC: 32.7 g/dL (ref 32.0–36.0)
MCV: 95 fL (ref 80–100)
Monocyte #: 0.7 x10 3/mm (ref 0.2–0.9)
Monocyte %: 5.2 %
Neutrophil %: 89.5 %
Platelet: 181 x10 3/mm (ref 150–440)

## 2013-10-11 LAB — BASIC METABOLIC PANEL
Calcium, Total: 8.4 mg/dL — ABNORMAL LOW (ref 8.5–10.1)
Co2: 29 mmol/L (ref 21–32)
Creatinine: 0.73 mg/dL (ref 0.60–1.30)
EGFR (African American): >60
Osmolality: 277 (ref 275–301)
Potassium: 3.2 mmol/L — ABNORMAL LOW (ref 3.5–5.1)
Sodium: 138 mmol/L (ref 136–145)

## 2013-10-12 ENCOUNTER — Ambulatory Visit: Payer: Self-pay | Admitting: Oncology

## 2013-10-12 LAB — CBC WITH DIFFERENTIAL/PLATELET
Basophil #: 0 10*3/uL (ref 0.0–0.1)
Basophil %: 0.6 %
Eosinophil #: 0 10*3/uL (ref 0.0–0.7)
Eosinophil %: 0.4 %
Lymphocyte #: 1 10*3/uL (ref 1.0–3.6)
MCH: 31.4 pg (ref 26.0–34.0)
Platelet: 185 10*3/uL (ref 150–440)
RBC: 2.52 10*6/uL — ABNORMAL LOW (ref 3.80–5.20)
RDW: 14.9 % — ABNORMAL HIGH (ref 11.5–14.5)
WBC: 6.8 10*3/uL (ref 3.6–11.0)

## 2013-10-12 LAB — COMPREHENSIVE METABOLIC PANEL
Albumin: 2.4 g/dL — ABNORMAL LOW (ref 3.4–5.0)
Anion Gap: 5 — ABNORMAL LOW (ref 7–16)
BUN: 6 mg/dL — ABNORMAL LOW (ref 7–18)
Bilirubin,Total: 0.2 mg/dL (ref 0.2–1.0)
Chloride: 111 mmol/L — ABNORMAL HIGH (ref 98–107)
Co2: 26 mmol/L (ref 21–32)
Creatinine: 0.55 mg/dL — ABNORMAL LOW (ref 0.60–1.30)
EGFR (African American): >60
Potassium: 3.8 mmol/L (ref 3.5–5.1)
SGOT(AST): 29 U/L (ref 15–37)
SGPT (ALT): 33 U/L (ref 12–78)
Sodium: 142 mmol/L (ref 136–145)

## 2013-10-12 LAB — URINE CULTURE

## 2013-10-13 LAB — CBC WITH DIFFERENTIAL/PLATELET
Basophil #: 0.1 10*3/uL (ref 0.0–0.1)
Eosinophil %: 0.2 %
HCT: 24.8 % — ABNORMAL LOW (ref 35.0–47.0)
HGB: 8.5 g/dL — ABNORMAL LOW (ref 12.0–16.0)
MCV: 93 fL (ref 80–100)
Monocyte #: 0.5 x10 3/mm (ref 0.2–0.9)
Neutrophil #: 5.4 10*3/uL (ref 1.4–6.5)
Neutrophil %: 78 %
Platelet: 248 10*3/uL (ref 150–440)
RBC: 2.68 10*6/uL — ABNORMAL LOW (ref 3.80–5.20)
RDW: 14.6 % — ABNORMAL HIGH (ref 11.5–14.5)
WBC: 6.9 10*3/uL (ref 3.6–11.0)

## 2013-10-13 LAB — URINALYSIS, COMPLETE
Bacteria: NONE SEEN
Bilirubin,UR: NEGATIVE
Blood: NEGATIVE
Ketone: NEGATIVE
Nitrite: NEGATIVE
Ph: 5 (ref 4.5–8.0)
Protein: NEGATIVE
RBC,UR: <1 /HPF (ref 0–5)
Specific Gravity: 1.009 (ref 1.003–1.030)
Squamous Epithelial: <1
WBC UR: 9 /HPF (ref 0–5)

## 2013-10-14 ENCOUNTER — Ambulatory Visit: Payer: Self-pay | Admitting: Oncology

## 2013-10-16 LAB — CULTURE, BLOOD (SINGLE)

## 2013-10-17 ENCOUNTER — Other Ambulatory Visit: Payer: Self-pay

## 2013-10-18 LAB — CBC CANCER CENTER
Basophil #: 0.2 x10 3/mm — ABNORMAL HIGH (ref 0.0–0.1)
Eosinophil #: 0 x10 3/mm (ref 0.0–0.7)
Eosinophil %: 0.8 %
HGB: 9.9 g/dL — ABNORMAL LOW (ref 12.0–16.0)
Lymphocyte #: 1.4 x10 3/mm (ref 1.0–3.6)
MCV: 94 fL (ref 80–100)
Monocyte %: 11.5 %
Neutrophil #: 2.1 x10 3/mm (ref 1.4–6.5)
Platelet: 510 x10 3/mm — ABNORMAL HIGH (ref 150–440)

## 2013-10-18 LAB — COMPREHENSIVE METABOLIC PANEL
Albumin: 3.2 g/dL — ABNORMAL LOW (ref 3.4–5.0)
Alkaline Phosphatase: 153 U/L — ABNORMAL HIGH (ref 50–136)
Anion Gap: 9 (ref 7–16)
BUN: 13 mg/dL (ref 7–18)
Calcium, Total: 8.9 mg/dL (ref 8.5–10.1)
Chloride: 102 mmol/L (ref 98–107)
EGFR (Non-African Amer.): >60
Osmolality: 282 (ref 275–301)
Potassium: 3.9 mmol/L (ref 3.5–5.1)
SGPT (ALT): 37 U/L (ref 12–78)
Sodium: 140 mmol/L (ref 136–145)
Total Protein: 6.9 g/dL (ref 6.4–8.2)

## 2013-10-25 LAB — BASIC METABOLIC PANEL
Anion Gap: 7 (ref 7–16)
BUN: 6 mg/dL — ABNORMAL LOW (ref 7–18)
Chloride: 99 mmol/L (ref 98–107)
Co2: 29 mmol/L (ref 21–32)
Creatinine: 0.81 mg/dL (ref 0.60–1.30)
EGFR (Non-African Amer.): >60
Potassium: 3.9 mmol/L (ref 3.5–5.1)
Sodium: 135 mmol/L — ABNORMAL LOW (ref 136–145)

## 2013-10-25 LAB — CBC CANCER CENTER
Basophil %: 1 %
HCT: 26.8 % — ABNORMAL LOW (ref 35.0–47.0)
Lymphocyte %: 9.2 %
MCH: 31.3 pg (ref 26.0–34.0)
MCHC: 32.8 g/dL (ref 32.0–36.0)
MCV: 95 fL (ref 80–100)
Monocyte #: 2.4 x10 3/mm — ABNORMAL HIGH (ref 0.2–0.9)
Monocyte %: 16.1 %
RDW: 16 % — ABNORMAL HIGH (ref 11.5–14.5)
WBC: 15.1 x10 3/mm — ABNORMAL HIGH (ref 3.6–11.0)

## 2013-10-25 LAB — FOLATE: Folic Acid: 4.8 ng/mL (ref 3.1–100.0)

## 2013-11-01 LAB — CBC CANCER CENTER
Basophil #: 0.1 x10 3/mm (ref 0.0–0.1)
Eosinophil #: 0 x10 3/mm (ref 0.0–0.7)
HCT: 29.3 % — ABNORMAL LOW (ref 35.0–47.0)
HGB: 9.9 g/dL — ABNORMAL LOW (ref 12.0–16.0)
MCH: 32.7 pg (ref 26.0–34.0)
MCHC: 34 g/dL (ref 32.0–36.0)
MCV: 96 fL (ref 80–100)
Monocyte #: 1 x10 3/mm — ABNORMAL HIGH (ref 0.2–0.9)
Neutrophil #: 6.8 x10 3/mm — ABNORMAL HIGH (ref 1.4–6.5)
Platelet: 161 x10 3/mm (ref 150–440)
RDW: 17.3 % — ABNORMAL HIGH (ref 11.5–14.5)
WBC: 10.3 x10 3/mm (ref 3.6–11.0)

## 2013-11-01 LAB — COMPREHENSIVE METABOLIC PANEL
Albumin: 3.3 g/dL — ABNORMAL LOW (ref 3.4–5.0)
Alkaline Phosphatase: 148 U/L — ABNORMAL HIGH (ref 50–136)
Chloride: 103 mmol/L (ref 98–107)
Co2: 27 mmol/L (ref 21–32)
Creatinine: 0.72 mg/dL (ref 0.60–1.30)
EGFR (African American): >60
EGFR (Non-African Amer.): >60
Glucose: 134 mg/dL — ABNORMAL HIGH (ref 65–99)
Potassium: 3.5 mmol/L (ref 3.5–5.1)

## 2013-11-01 LAB — MAGNESIUM: Magnesium: 1.8 mg/dL

## 2013-11-04 LAB — COMPREHENSIVE METABOLIC PANEL
BUN: 11 mg/dL (ref 7–18)
Bilirubin,Total: 0.2 mg/dL (ref 0.2–1.0)
Chloride: 101 mmol/L (ref 98–107)
Co2: 31 mmol/L (ref 21–32)
Creatinine: 0.65 mg/dL (ref 0.60–1.30)
Glucose: 86 mg/dL (ref 65–99)
Osmolality: 274 (ref 275–301)
Potassium: 3.8 mmol/L (ref 3.5–5.1)
SGOT(AST): 17 U/L (ref 15–37)
Total Protein: 6.3 g/dL — ABNORMAL LOW (ref 6.4–8.2)

## 2013-11-04 LAB — CBC CANCER CENTER
Eosinophil #: 0 x10 3/mm (ref 0.0–0.7)
HCT: 30.9 % — ABNORMAL LOW (ref 35.0–47.0)
Lymphocyte #: 1.7 x10 3/mm (ref 1.0–3.6)
Lymphocyte %: 23.5 %
MCH: 31.6 pg (ref 26.0–34.0)
MCV: 97 fL (ref 80–100)
Neutrophil #: 4.7 x10 3/mm (ref 1.4–6.5)
Platelet: 215 x10 3/mm (ref 150–440)
RDW: 17.7 % — ABNORMAL HIGH (ref 11.5–14.5)
WBC: 7.2 x10 3/mm (ref 3.6–11.0)

## 2013-11-04 LAB — MAGNESIUM: Magnesium: 1.9 mg/dL

## 2013-11-04 LAB — VANCOMYCIN, TROUGH: Vancomycin, Trough: 6 ug/mL — ABNORMAL LOW (ref 10–20)

## 2013-11-05 ENCOUNTER — Encounter: Payer: Self-pay | Admitting: *Deleted

## 2013-11-11 ENCOUNTER — Ambulatory Visit: Payer: Self-pay | Admitting: Oncology

## 2013-11-11 ENCOUNTER — Emergency Department: Payer: Self-pay | Admitting: Emergency Medicine

## 2013-11-11 LAB — COMPREHENSIVE METABOLIC PANEL
Albumin: 3.6 g/dL (ref 3.4–5.0)
Anion Gap: 4 — ABNORMAL LOW (ref 7–16)
BUN: 14 mg/dL (ref 7–18)
Bilirubin,Total: 0.5 mg/dL (ref 0.2–1.0)
Calcium, Total: 9.2 mg/dL (ref 8.5–10.1)
Creatinine: 0.77 mg/dL (ref 0.60–1.30)
EGFR (African American): >60
EGFR (Non-African Amer.): >60
Glucose: 83 mg/dL (ref 65–99)
Potassium: 4.4 mmol/L (ref 3.5–5.1)
SGPT (ALT): 35 U/L (ref 12–78)
Total Protein: 6.7 g/dL (ref 6.4–8.2)

## 2013-11-11 LAB — CBC
HGB: 11.4 g/dL — ABNORMAL LOW (ref 12.0–16.0)
MCH: 32.6 pg (ref 26.0–34.0)
MCHC: 34 g/dL (ref 32.0–36.0)
MCV: 96 fL (ref 80–100)
Platelet: 317 10*3/uL (ref 150–440)
RBC: 3.5 10*6/uL — ABNORMAL LOW (ref 3.80–5.20)

## 2013-11-11 LAB — TROPONIN I: Troponin-I: <0.02 ng/mL

## 2013-11-15 ENCOUNTER — Ambulatory Visit: Payer: Self-pay | Admitting: Oncology

## 2013-11-15 LAB — CBC CANCER CENTER
Basophil %: 1.8 %
Eosinophil #: 0.3 x10 3/mm (ref 0.0–0.7)
Eosinophil %: 5.5 %
HCT: 31.2 % — ABNORMAL LOW (ref 35.0–47.0)
HGB: 10.3 g/dL — ABNORMAL LOW (ref 12.0–16.0)
Lymphocyte %: 17.2 %
MCH: 32.3 pg (ref 26.0–34.0)
MCV: 98 fL (ref 80–100)
Monocyte #: 0.5 x10 3/mm (ref 0.2–0.9)
Monocyte %: 8 %
Neutrophil #: 3.8 x10 3/mm (ref 1.4–6.5)
Neutrophil %: 67.5 %
RBC: 3.19 10*6/uL — ABNORMAL LOW (ref 3.80–5.20)
RDW: 16.5 % — ABNORMAL HIGH (ref 11.5–14.5)
WBC: 5.6 x10 3/mm (ref 3.6–11.0)

## 2013-11-15 LAB — COMPREHENSIVE METABOLIC PANEL
Anion Gap: 5 — ABNORMAL LOW (ref 7–16)
BUN: 11 mg/dL (ref 7–18)
Chloride: 102 mmol/L (ref 98–107)
Creatinine: 0.75 mg/dL (ref 0.60–1.30)
EGFR (African American): >60
EGFR (Non-African Amer.): >60
Glucose: 104 mg/dL — ABNORMAL HIGH (ref 65–99)
Osmolality: 274 (ref 275–301)
SGOT(AST): 26 U/L (ref 15–37)
SGPT (ALT): 34 U/L (ref 12–78)

## 2013-11-26 LAB — CBC CANCER CENTER
Eosinophil %: 8.4 %
HCT: 34.6 % — ABNORMAL LOW (ref 35.0–47.0)
Lymphocyte %: 20.7 %
MCHC: 32.4 g/dL (ref 32.0–36.0)
MCV: 98 fL (ref 80–100)
Monocyte #: 0.3 x10 3/mm (ref 0.2–0.9)
Monocyte %: 6.9 %
Neutrophil %: 62.7 %
RDW: 15.3 % — ABNORMAL HIGH (ref 11.5–14.5)
WBC: 4.8 x10 3/mm (ref 3.6–11.0)

## 2013-12-03 LAB — CBC CANCER CENTER
Basophil #: 0.1 x10 3/mm (ref 0.0–0.1)
Basophil %: 0.6 %
Eosinophil %: 0.3 %
HCT: 35.2 % (ref 35.0–47.0)
HGB: 11.4 g/dL — ABNORMAL LOW (ref 12.0–16.0)
Lymphocyte #: 1.4 x10 3/mm (ref 1.0–3.6)
Lymphocyte %: 8.7 %
MCH: 31.1 pg (ref 26.0–34.0)
MCHC: 32.3 g/dL (ref 32.0–36.0)
MCV: 96 fL (ref 80–100)
Monocyte #: 1.8 x10 3/mm — ABNORMAL HIGH (ref 0.2–0.9)
Monocyte %: 10.7 %
Neutrophil #: 13.1 x10 3/mm — ABNORMAL HIGH (ref 1.4–6.5)
Platelet: 163 x10 3/mm (ref 150–440)
RBC: 3.66 10*6/uL — ABNORMAL LOW (ref 3.80–5.20)
RDW: 14 % (ref 11.5–14.5)

## 2013-12-04 ENCOUNTER — Encounter: Payer: Self-pay | Admitting: Neurology

## 2013-12-10 ENCOUNTER — Encounter: Payer: Self-pay | Admitting: Neurology

## 2013-12-10 LAB — CBC CANCER CENTER
Basophil #: 0.1 x10 3/mm (ref 0.0–0.1)
Basophil %: 0.8 %
Eosinophil #: 0 x10 3/mm (ref 0.0–0.7)
Eosinophil %: 0 %
HCT: 37 % (ref 35.0–47.0)
HGB: 11.9 g/dL — ABNORMAL LOW (ref 12.0–16.0)
Lymphocyte #: 1.5 x10 3/mm (ref 1.0–3.6)
MCH: 31.3 pg (ref 26.0–34.0)
Neutrophil #: 8.9 x10 3/mm — ABNORMAL HIGH (ref 1.4–6.5)
Platelet: 264 x10 3/mm (ref 150–440)
RBC: 3.8 10*6/uL (ref 3.80–5.20)
RDW: 14.7 % — ABNORMAL HIGH (ref 11.5–14.5)
WBC: 11.3 x10 3/mm — ABNORMAL HIGH (ref 3.6–11.0)

## 2013-12-12 ENCOUNTER — Ambulatory Visit: Payer: Self-pay | Admitting: Oncology

## 2013-12-16 ENCOUNTER — Encounter: Payer: Self-pay | Admitting: Neurology

## 2013-12-17 ENCOUNTER — Ambulatory Visit: Payer: Self-pay | Admitting: Oncology

## 2013-12-17 LAB — COMPREHENSIVE METABOLIC PANEL
ALBUMIN: 3.4 g/dL (ref 3.4–5.0)
ALK PHOS: 95 U/L
ALT: 25 U/L (ref 12–78)
Anion Gap: 9 (ref 7–16)
BUN: 8 mg/dL (ref 7–18)
Bilirubin,Total: 0.2 mg/dL (ref 0.2–1.0)
CALCIUM: 9.3 mg/dL (ref 8.5–10.1)
CHLORIDE: 99 mmol/L (ref 98–107)
Co2: 28 mmol/L (ref 21–32)
Creatinine: 0.82 mg/dL (ref 0.60–1.30)
Glucose: 141 mg/dL — ABNORMAL HIGH (ref 65–99)
Osmolality: 273 (ref 275–301)
POTASSIUM: 3.7 mmol/L (ref 3.5–5.1)
SGOT(AST): 25 U/L (ref 15–37)
Sodium: 136 mmol/L (ref 136–145)
TOTAL PROTEIN: 6.7 g/dL (ref 6.4–8.2)

## 2013-12-17 LAB — CBC CANCER CENTER
BASOS ABS: 0.1 x10 3/mm (ref 0.0–0.1)
Basophil %: 2.4 %
Eosinophil #: 0 x10 3/mm (ref 0.0–0.7)
Eosinophil %: 0.3 %
HCT: 35.9 % (ref 35.0–47.0)
HGB: 11.8 g/dL — ABNORMAL LOW (ref 12.0–16.0)
LYMPHS ABS: 0.9 x10 3/mm — AB (ref 1.0–3.6)
Lymphocyte %: 23.5 %
MCH: 32 pg (ref 26.0–34.0)
MCHC: 33 g/dL (ref 32.0–36.0)
MCV: 97 fL (ref 80–100)
MONOS PCT: 9.6 %
Monocyte #: 0.4 x10 3/mm (ref 0.2–0.9)
NEUTROS ABS: 2.6 x10 3/mm (ref 1.4–6.5)
Neutrophil %: 64.2 %
PLATELETS: 340 x10 3/mm (ref 150–440)
RBC: 3.7 10*6/uL — ABNORMAL LOW (ref 3.80–5.20)
RDW: 14.3 % (ref 11.5–14.5)
WBC: 4 x10 3/mm (ref 3.6–11.0)

## 2013-12-24 LAB — CBC CANCER CENTER
Basophil #: 0.1 x10 3/mm (ref 0.0–0.1)
Basophil %: 0.7 %
Eosinophil #: 0 x10 3/mm (ref 0.0–0.7)
Eosinophil %: 0.2 %
HCT: 36.4 % (ref 35.0–47.0)
HGB: 11.9 g/dL — AB (ref 12.0–16.0)
LYMPHS ABS: 1.2 x10 3/mm (ref 1.0–3.6)
LYMPHS PCT: 9.7 %
MCH: 31.5 pg (ref 26.0–34.0)
MCHC: 32.8 g/dL (ref 32.0–36.0)
MCV: 96 fL (ref 80–100)
Monocyte #: 1.5 x10 3/mm — ABNORMAL HIGH (ref 0.2–0.9)
Monocyte %: 12.6 %
Neutrophil #: 9.1 x10 3/mm — ABNORMAL HIGH (ref 1.4–6.5)
Neutrophil %: 76.8 %
Platelet: 166 x10 3/mm (ref 150–440)
RBC: 3.78 10*6/uL — AB (ref 3.80–5.20)
RDW: 14.1 % (ref 11.5–14.5)
WBC: 11.8 x10 3/mm — ABNORMAL HIGH (ref 3.6–11.0)

## 2013-12-31 LAB — CBC CANCER CENTER
BASOS ABS: 0 x10 3/mm (ref 0.0–0.1)
Basophil %: 0.4 %
Eosinophil #: 0 x10 3/mm (ref 0.0–0.7)
Eosinophil %: 0.1 %
HCT: 38.2 % (ref 35.0–47.0)
HGB: 12.4 g/dL (ref 12.0–16.0)
Lymphocyte #: 1.2 x10 3/mm (ref 1.0–3.6)
Lymphocyte %: 11.1 %
MCH: 31.3 pg (ref 26.0–34.0)
MCHC: 32.5 g/dL (ref 32.0–36.0)
MCV: 96 fL (ref 80–100)
Monocyte #: 0.6 x10 3/mm (ref 0.2–0.9)
Monocyte %: 5.5 %
NEUTROS ABS: 9.2 x10 3/mm — AB (ref 1.4–6.5)
Neutrophil %: 82.9 %
PLATELETS: 215 x10 3/mm (ref 150–440)
RBC: 3.98 10*6/uL (ref 3.80–5.20)
RDW: 14.1 % (ref 11.5–14.5)
WBC: 11.1 x10 3/mm — AB (ref 3.6–11.0)

## 2014-01-07 LAB — CBC CANCER CENTER
BASOS ABS: 0.1 x10 3/mm (ref 0.0–0.1)
Basophil %: 3.4 %
Eosinophil #: 0 x10 3/mm (ref 0.0–0.7)
Eosinophil %: 0.3 %
HCT: 36.5 % (ref 35.0–47.0)
HGB: 12.2 g/dL (ref 12.0–16.0)
LYMPHS PCT: 23.5 %
Lymphocyte #: 0.9 x10 3/mm — ABNORMAL LOW (ref 1.0–3.6)
MCH: 32 pg (ref 26.0–34.0)
MCHC: 33.3 g/dL (ref 32.0–36.0)
MCV: 96 fL (ref 80–100)
MONO ABS: 0.4 x10 3/mm (ref 0.2–0.9)
MONOS PCT: 10.1 %
NEUTROS ABS: 2.5 x10 3/mm (ref 1.4–6.5)
Neutrophil %: 62.7 %
Platelet: 218 x10 3/mm (ref 150–440)
RBC: 3.8 10*6/uL (ref 3.80–5.20)
RDW: 14.4 % (ref 11.5–14.5)
WBC: 3.9 x10 3/mm (ref 3.6–11.0)

## 2014-01-07 LAB — COMPREHENSIVE METABOLIC PANEL
ALBUMIN: 3.8 g/dL (ref 3.4–5.0)
ALK PHOS: 105 U/L
ALT: 30 U/L (ref 12–78)
ANION GAP: 6 — AB (ref 7–16)
AST: 29 U/L (ref 15–37)
BILIRUBIN TOTAL: 0.3 mg/dL (ref 0.2–1.0)
BUN: 10 mg/dL (ref 7–18)
CHLORIDE: 98 mmol/L (ref 98–107)
CREATININE: 0.74 mg/dL (ref 0.60–1.30)
Calcium, Total: 8.6 mg/dL (ref 8.5–10.1)
Co2: 29 mmol/L (ref 21–32)
EGFR (African American): >60
Glucose: 96 mg/dL (ref 65–99)
Osmolality: 265 (ref 275–301)
Potassium: 3.9 mmol/L (ref 3.5–5.1)
SODIUM: 133 mmol/L — AB (ref 136–145)
Total Protein: 6.5 g/dL (ref 6.4–8.2)

## 2014-01-08 LAB — CANCER ANTIGEN 27.29: CA 27.29: 25.3 U/mL (ref 0.0–38.6)

## 2014-01-12 ENCOUNTER — Ambulatory Visit: Payer: Self-pay | Admitting: Oncology

## 2014-01-14 LAB — COMPREHENSIVE METABOLIC PANEL
ANION GAP: 10 (ref 7–16)
Albumin: 4 g/dL (ref 3.4–5.0)
Alkaline Phosphatase: 150 U/L — ABNORMAL HIGH
BUN: 4 mg/dL — ABNORMAL LOW (ref 7–18)
Bilirubin,Total: 0.3 mg/dL (ref 0.2–1.0)
CHLORIDE: 95 mmol/L — AB (ref 98–107)
Calcium, Total: 8.8 mg/dL (ref 8.5–10.1)
Co2: 28 mmol/L (ref 21–32)
Creatinine: 0.7 mg/dL (ref 0.60–1.30)
EGFR (African American): >60
EGFR (Non-African Amer.): >60
Glucose: 106 mg/dL — ABNORMAL HIGH (ref 65–99)
OSMOLALITY: 264 (ref 275–301)
Potassium: 3.2 mmol/L — ABNORMAL LOW (ref 3.5–5.1)
SGOT(AST): 39 U/L — ABNORMAL HIGH (ref 15–37)
SGPT (ALT): 45 U/L (ref 12–78)
Sodium: 133 mmol/L — ABNORMAL LOW (ref 136–145)
Total Protein: 7 g/dL (ref 6.4–8.2)

## 2014-01-14 LAB — CBC CANCER CENTER
Basophil #: 0.1 x10 3/mm (ref 0.0–0.1)
Basophil %: 0.7 %
Eosinophil #: 0 x10 3/mm (ref 0.0–0.7)
Eosinophil %: 0.4 %
HCT: 37.4 % (ref 35.0–47.0)
HGB: 12.5 g/dL (ref 12.0–16.0)
LYMPHS PCT: 11.4 %
Lymphocyte #: 1.1 x10 3/mm (ref 1.0–3.6)
MCH: 31.9 pg (ref 26.0–34.0)
MCHC: 33.4 g/dL (ref 32.0–36.0)
MCV: 96 fL (ref 80–100)
MONO ABS: 1.4 x10 3/mm — AB (ref 0.2–0.9)
MONOS PCT: 15.5 %
NEUTROS ABS: 6.7 x10 3/mm — AB (ref 1.4–6.5)
NEUTROS PCT: 72 %
Platelet: 135 x10 3/mm — ABNORMAL LOW (ref 150–440)
RBC: 3.91 10*6/uL (ref 3.80–5.20)
RDW: 14.2 % (ref 11.5–14.5)
WBC: 9.3 x10 3/mm (ref 3.6–11.0)

## 2014-01-22 LAB — CBC CANCER CENTER
BASOS ABS: 0 x10 3/mm (ref 0.0–0.1)
BASOS PCT: 0.4 %
Eosinophil #: 0 x10 3/mm (ref 0.0–0.7)
Eosinophil %: 0 %
HCT: 39.2 % (ref 35.0–47.0)
HGB: 12.9 g/dL (ref 12.0–16.0)
LYMPHS ABS: 1.1 x10 3/mm (ref 1.0–3.6)
Lymphocyte %: 13.6 %
MCH: 32 pg (ref 26.0–34.0)
MCHC: 33 g/dL (ref 32.0–36.0)
MCV: 97 fL (ref 80–100)
Monocyte #: 0.6 x10 3/mm (ref 0.2–0.9)
Monocyte %: 7.2 %
Neutrophil #: 6.2 x10 3/mm (ref 1.4–6.5)
Neutrophil %: 78.8 %
Platelet: 204 x10 3/mm (ref 150–440)
RBC: 4.05 10*6/uL (ref 3.80–5.20)
RDW: 14.7 % — ABNORMAL HIGH (ref 11.5–14.5)
WBC: 7.9 x10 3/mm (ref 3.6–11.0)

## 2014-02-09 ENCOUNTER — Ambulatory Visit: Payer: Self-pay | Admitting: Oncology

## 2014-02-11 LAB — CBC CANCER CENTER
BASOS PCT: 1.2 %
Basophil #: 0 x10 3/mm (ref 0.0–0.1)
EOS ABS: 0.1 x10 3/mm (ref 0.0–0.7)
EOS PCT: 2.5 %
HCT: 41.3 % (ref 35.0–47.0)
HGB: 13.5 g/dL (ref 12.0–16.0)
LYMPHS ABS: 0.9 x10 3/mm — AB (ref 1.0–3.6)
Lymphocyte %: 23.8 %
MCH: 32 pg (ref 26.0–34.0)
MCHC: 32.6 g/dL (ref 32.0–36.0)
MCV: 98 fL (ref 80–100)
MONOS PCT: 8.6 %
Monocyte #: 0.3 x10 3/mm (ref 0.2–0.9)
NEUTROS PCT: 63.9 %
Neutrophil #: 2.3 x10 3/mm (ref 1.4–6.5)
Platelet: 214 x10 3/mm (ref 150–440)
RBC: 4.2 10*6/uL (ref 3.80–5.20)
RDW: 15.5 % — AB (ref 11.5–14.5)
WBC: 3.6 x10 3/mm (ref 3.6–11.0)

## 2014-02-11 LAB — COMPREHENSIVE METABOLIC PANEL
ALBUMIN: 3.9 g/dL (ref 3.4–5.0)
ALK PHOS: 103 U/L
AST: 27 U/L (ref 15–37)
Anion Gap: 7 (ref 7–16)
BUN: 10 mg/dL (ref 7–18)
Bilirubin,Total: 0.4 mg/dL (ref 0.2–1.0)
CHLORIDE: 99 mmol/L (ref 98–107)
Calcium, Total: 8.5 mg/dL (ref 8.5–10.1)
Co2: 30 mmol/L (ref 21–32)
Creatinine: 0.72 mg/dL (ref 0.60–1.30)
EGFR (Non-African Amer.): >60
Glucose: 162 mg/dL — ABNORMAL HIGH (ref 65–99)
OSMOLALITY: 275 (ref 275–301)
Potassium: 3.5 mmol/L (ref 3.5–5.1)
SGPT (ALT): 28 U/L (ref 12–78)
Sodium: 136 mmol/L (ref 136–145)
TOTAL PROTEIN: 6.7 g/dL (ref 6.4–8.2)

## 2014-03-12 ENCOUNTER — Ambulatory Visit: Payer: Self-pay | Admitting: Oncology

## 2014-10-13 ENCOUNTER — Encounter: Payer: Self-pay | Admitting: General Surgery

## 2015-04-03 NOTE — Op Note (Signed)
PATIENT NAME:  Wanda [name], Wanda [name] MR#:  [id number] DATE OF BIRTH:  Apr 14, 1941  DATE OF PROCEDURE:  08/22/2013  PREOPERATIVE DIAGNOSIS: Breast cancer, need for central venous access.   POSTOPERATIVE DIAGNOSIS: Breast cancer, need for central venous access.   OPERATIVE PROCEDURE: Left subclavian PowerPort placement with ultrasound and fluoroscopic guidance.   SURGEON: Hervey Ard, M.D.   ANESTHESIA: Attended local, 15 mL of 1% plain Xylocaine.   ESTIMATED BLOOD LOSS: Minimal.   CLINICAL NOTE: This 74 year old woman has undergone a right skin-sparing mastectomy and is felt to be a candidate for adjuvant chemotherapy. Central venous access was requested by the treating oncologist. The patient received Kefzol prior to the procedure.   OPERATIVE NOTE: With the patient comfortably supine on the operating table, the left chest/neck was prepped with ChloraPrep and draped. Ultrasound confirmed patency of the left subclavian vein. The vein was cannulated under ultrasound guidance. The guidewire was advanced and the dilator placed. The catheter tip was positioned at the junction of the SVC and right atrium under fluoroscopic guidance. It was then tunneled to a pocket on the left anterior chest. It was anchored with 3-point fixation. It easily irrigated and aspirated with the patient in the supine position. 10 mL of injectable saline was flushed at the end of the procedure. The wound was closed in layers with 3-0 Vicryl deep and running 4-0 Vicryl subcuticular suture for the skin. Benzoin, Steri-Strips, Telfa and Tegaderm dressings were applied.   Erect portable chest x-ray obtained in the recovery room showed the catheter tip as described above and no evidence of pneumothorax.   ____________________________ Robert Bellow, MD [id number] D: 08/23/2013 16:18:00 ET T: 08/23/2013 23:01:34 ET JOB#: [id number]  cc: Robert Bellow, MD, <Dictator> Martie Lee. Oliva Bustard, MD Leona Carry Hall Busing, MD JEFFREY Amedeo Kinsman  MD ELECTRONICALLY SIGNED 08/25/2013 10:19

## 2015-04-03 NOTE — Consult Note (Signed)
HPI: Referred by Dr. Jamal Collin, PCP is Dr. Benita Stabile, Dr. Manuella Ghazi is neurologist.   This 74 year old Female patient presents to the clinic for follow up  for Breast Cancer.  Recent Travel:  CC Travel Screening:  Have you traveled internationally in the last 21 days? No   Chief Complaint:  Historian Patient   Presenting Problem patient is here for ongoing evaluation and treatment consideration regarding breast cancer.  states is feeling much better after starting prozac. offers no complaints. Patient does not have living will, FULL CODE.   Negative Symptoms fever, chills, anorexia, weakness, nausea, vomiting, diarrhea, constipation, cough, shortness of breath, palpitations, burning with urination, urinary frequency, headache, numbness/tingling, back pain, hip pain, rash    Temp 96.4   Temperature Source tympanic   Pulse 93   Respirations 18   SBP 120   DBP 81   Pain Scale (0-10) 0   Infection Control Screen No known Infectious Diseases   Current Weight (kg) 56.5 kg   Height (cm) 165 cm   Current Weight (lbs) 124.5 lb   BSA (m2) 1.6 M2   Past Hx:   Right breast cancer:    Colon Ca:    Depression:    Marchetta:    Right Mastectomy:   Home Medications:  Home Medications:  Home Medication List   Patient's Home Medications on Admission: (Review Completed on 26-Nov-2013 at 09:23)   Medication Instructions  prozac 20 mg oral capsule  1 cap(s) orally once a day   pantoprazole 40 mg oral delayed release tablet  1 tab(s) orally once a day (in the morning)   alprazolam 2 mg oral tablet, extended release  0.5 tab(s) orally once a day (at bedtime)   acetaminophen-hydrocodone 325 mg-5 mg oral tablet  1 tab(s) orally every 4 to 6 hours, As Needed, pain - for Pain     Allergies:   Cipro: Unknown  All Dairy Products: Unknown  IVP Dye: Anaphylaxis  Senna: Swelling  Penicillin: Rash  Adhesive: Blisters  Meds - Hx -  Allergies:  Home Meds Status Patient/Family  (Verbal)   Allergy and History Updated Allergy history has been reviewed and updated for this visit.   Fall Risk Screening:  Age 59 - 62   Mental Status Oriented or comatose   Elimination Continent & independent   Impairments None known   BP Within normal limits   Gait/ Mobility N/A   Current Medication Narcotics  Benzodiazepines   Predisposing Conditions N/A   Length of Stay 0-3 days   Fall Risk Screening Total 7   Fall Risk Interventions:  Fall Risk Category Category I   Fall Risk Interventions- Category I Thorough orientation of surroundings  Remove unnecessary furniture  Non-slip footwear for ambulating  Patient has recently been seen by Dr. Manuella Ghazi neurologist, EMG showed severe motor polyneuropathy. No evidence of radiculopathy.has a history of CVA, on the right side with CT evidence of infarct on the corona radiata. examinationis in a wheel chair, has pain in bilaterally SI joint area. lower extremity 5/5 in iliopsoas,gastrocs, ATL and EHL,lower extremity weak with 2-3/5 in all groups, some is effort dependent. does not reveal any pathology. Complex regional pain syndrome, which could also be secondary polyneuropathy vs central stroke, Unable to explain this based on any lumbar pathology.with lyricaAlso agree with imaging with MRI lumbar and brain to evaluate stroke when she has reconstructive breast surgery.   Electronic Signatures: Shirlean Mylar (MD)  (Signed on 16-Dec-14 11:01)  Authored  Last Updated:  16-Dec-14 11:01 by Shirlean Mylar (MD)

## 2015-04-03 NOTE — Op Note (Signed)
PATIENT NAME:  Wanda [name], Wanda [name] MR#:  [id number] DATE OF BIRTH:  1941-10-03  DATE OF PROCEDURE:  07/18/2013  PREOPERATIVE DIAGNOSIS:  Right breast cancer.   POSTOPERATIVE DIAGNOSIS:  Right breast cancer.  OPERATIVE PROCEDURE:  Right simple mastectomy, sentinel node biopsy, axillary dissection, immediate reconstruction.   SURGEONS:  Hervey Ard, M.D., Nicholaus Bloom, M.D.   ESTIMATED BLOOD LOSS:  Less than 100 mL.   FLUID REPLACEMENT:  Crystalloid.   CLINICAL NOTE:  This 74 year old woman was recently noted with a 2 cm mass in the right breast.  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  The patient strongly desired breast conservation.  Plans were for upper right mastectomy with sacrifice of the nipple/areolar complex and immediate reconstruction if possible.   The patient received vancomycin prior to the procedure.  The ED stockings and pneumatic compression stockings were used for DVT prophylaxis.   The patient underwent general anesthesia by LMA under the care of Alvin Critchley, M.D.   With the patient under adequate general anesthesia, the breast was prepped with ChloraPrep with Hibiclens and draped.  An elliptical incision was outlined staying approximately 1 cm away from the palpable tumor in the 4 o'clock position of the right breast at the edge of the areola.  Skin was incised sharply and the remaining dissection completed with electrocautery and the Harmonic scalpel.  Prior to surgery the patient had been injected with technetium sulfur colloid and just prior to prepping, 4 mL of methylene blue diluted 1:2 with normal saline was instilled in the subareolar plexus.  The flaps were elevated preserving the medial based perforators to the skin.  The medial edge of dissection was the costochondral cartilage, superiorly just below the clavicle, inferiorly to the inframammary fold and laterally to the serratus muscle.  The breast was elevated off the underlying pectoralis  muscle with electrocautery.  A single hot blue 1.2 cm firm lymph node was reported as positive for macro metastatic disease by Delorse Lek, M.D. from pathology.  This region was elected to proceed to axillary dissection.  The axillary envelope was opened, the axillary vein identified and the contents inferior to the vein were swept away.  The long thoracic nerve of Bell and thoracodorsal nerve artery and vein bundle were protected and functional at the end of the procedure.  The breast was sent fresh per protocol.  The axillary contents sent in formalin.  At this point the procedure was turned over to Dr. Tula Nakayama for reconstruction and this will be dictated separately.   The patient did tolerate the procedure well.    ____________________________ Robert Bellow, MD jwb:ea D: 07/18/2013 21:11:39 ET T: 07/19/2013 04:44:23 ET JOB#: [id number]  cc: Robert Bellow, MD, <Dictator> Leona Carry. Hall Busing, MD Cleda Daub, MD Robert Bellow, MD JEFFREY Amedeo Kinsman MD ELECTRONICALLY SIGNED 07/20/2013 9:35

## 2015-04-03 NOTE — Op Note (Signed)
PATIENT NAME:  Wanda [name], Wanda [name] MR#:  [id number] DATE OF BIRTH:  Apr 13, 1941  DATE OF PROCEDURE:  07/18/2013  PREOPERATIVE DIAGNOSIS:  Right breast cancer.   POSTOPERATIVE DIAGNOSIS:  Right breast cancer.   PROCEDURE: 1.  Assist right breast mastectomy.  2.  Immediate breast reconstruction with tissue expander.  3.  Placement of acellular dermal matrix (130 sq cm).   STAFF SURGEON:  Cleda Daub, MD   ANESTHESIA:  LMA general.   ESTIMATED BLOOD LOSS:  25 mL.   COMPLICATIONS:  None.   FINDINGS:  Good symmetry and hemostasis at completion of procedure with excellent flap perfusion.   DISPOSITION:  Stable to recovery.   STATEMENT OF NECESSITY:  The patient is a very pleasant 74 year old female with a new diagnosis of right breast cancer. The patient has elected for immediate reconstruction after mastectomies. The risks, benefits and alternatives have been thoroughly reviewed with her and she has requested the procedure as described.   STATEMENT OF PROCEDURE:  The patient was brought to the Operating Room awake, alert and comfortable and placed on the OR table in the supine position with both arms outstretched. Bony prominences were padded. SCDs were on and functioning. The patient's chest was then prepped and draped in the usual sterile fashion. At this point, I assisted Dr. Bary Castilla to perform a mastectomy. This will be dictated by him as the primary surgeon. At the completion of the procedure, the lymph node touch prep proved to be positive and he then performed an axillary dissection (I did not assist during this portion). At the completion of the axillary dissection, I then resumed the care of the patient.   The wound was clean with no evidence of ischemia. The tissue flaps were of excellent quality. There was no active bleeding. At this point, the wound was copiously irrigated with triple antibiotic irrigation, and the inferior edge of the pectoralis was elevated up off of the costal  margin up to the sternum. Two 15-French Blake drains were brought out through stab incisions inferior lateral to be placed above and below the pectoralis. Next, a piece of rehydrated, ready to use, acellular dermal matrix was prepared. Using 2-0 PDS sutures along the inframammary fold, the inframammary fold was tacked down and re-created in a 3-way tight horizontal mattress suture all along the IMF. Next, the elliptical piece of AlloDerm was tucked underneath the edge of the pectoralis and was sutured with a running continuous 2-0 Vicryl beginning at the sternum and extending approximately 2/3 of the way. Several interrupted sutures were also used to along the lateral breast border to re-create the lateral edge of the breast at the anterior axillary line. The drains were placed in position. The wound was irrigated again. I then prepared the expander by removing all air and filling it with 2 mL of methylene blue. Finally, the expander was placed along the IMF underneath the AlloDerm and pec muscle and the closure was completed with the excess AlloDerm underneath the pec. This gave an excellent contour. The skin edges were then approximated with interrupted 3-0 deep dermal Vicryl and then the port was accessed percutaneously. A total of 250 mL was infiltrated. The tissue specimen removed was 418 gram. This gave an excellent early contour, closed the dead space, and left the overlying tissue completely relaxed with no tension. The closure was completed with 4-0 Monocryl, Dermabond and sterile dressing. Needle, sponge, and lap counts were correct. She tolerated the procedure well. Total blood loss was less than  20 mL.    ____________________________ Cleda Daub, MD bsc:jm D: 07/20/2013 14:43:00 ET T: 07/20/2013 15:08:02 ET JOB#: [id number]  cc: Cleda Daub, MD, <Dictator> Cleda Daub MD ELECTRONICALLY SIGNED 07/29/2013 18:11

## 2015-04-03 NOTE — Consult Note (Signed)
PATIENT NAME:  Wanda [name], Wanda [name] MR#:  [id number] DATE OF BIRTH:  [birth year]  DATE OF CONSULTATION:  10/13/2013  CONSULTING PHYSICIAN:  S.G. Jamal Collin, MD REQUESTING PHYSICIAN: Dr. Inez Pilgrim.   REASON: Perirectal abscess.   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 She was subsequently started on chemotherapy and has completed two cycles. For the last month she reports having developed a boil-like area in the left ischial region, which has continued to bother her and more recently it became a little bit more inflamed and uncomfortable. She also had some evidence of some cellulitis in the fascial area and she was therefore admitted and started on vancomycin. She states that since her admission and the vancomycin the area has drained and is actually feeling better. She has had no prior history of any rectal or perirectal problems. Otherwise, the patient has been doing reasonably well with the chemotherapy. The further details are noted from the admitting H and P.   PHYSICAL EXAMINATION: On examination the patient is a very pleasant, alert and oriented female who is not in any acute distress at the time. She has been afebrile for the last 48 hours. Her high white count, which was elevated at 13,000 is back to normal. Evaluation is limited to the rectal area. In the left ischial region she has about 3 to 4 cm area of induration with a 73m   opening in the middle through which it has already drained some pus. There is no active drainage noted at this time nor there any evidence of residual pus in this area. The process appears to be contained in the skin and subcutaneous tissue of the ischial region and has no apparent connection to the rectum. No lymphadenopathy noted in the groins. Abdomen exam is otherwise unremarkable.   IMPRESSION: Left ischial abscess contained  in the ischial area, appears to have drained on its own. At present there is no need for any additional surgical intervention. However, once the initial inflammatory process is resolved, if there is any palpable abnormality left in this site, such as a cyst or a residual pocket, it may require excision. I think this can be assessed in about a month's time once the acute inflammation has subsided.   Thank you for allowing me to evaluate and help in the care of this patient.  ____________________________ S.GRobinette Haines MD sgs:sg D: 10/13/2013 09:30:38 ET T: 10/13/2013 10:22:59 ET JOB#: [id number] cc: SSynthia Innocent SJamal Collin MD, <Dictator> SMercy Medical CenterGRobinette HainesMD ELECTRONICALLY SIGNED 10/14/2013 19:03

## 2015-05-13 DEATH — deceased
